# Patient Record
Sex: Male | Born: 1950
Health system: Southern US, Community
[De-identification: ages and names within clinical notes are randomized; demographics above are authoritative.]

## PROBLEM LIST (undated history)

## (undated) DIAGNOSIS — E119 Type 2 diabetes mellitus without complications: Secondary | ICD-10-CM

## (undated) DIAGNOSIS — E785 Hyperlipidemia, unspecified: Secondary | ICD-10-CM

## (undated) DIAGNOSIS — F419 Anxiety disorder, unspecified: Secondary | ICD-10-CM

## (undated) HISTORY — DX: Type 2 diabetes mellitus without complications: E11.9

## (undated) HISTORY — DX: Anxiety disorder, unspecified: F41.9

## (undated) HISTORY — PX: EYE SURGERY: SHX253

## (undated) HISTORY — DX: Hyperlipidemia, unspecified: E78.5

---

## 2013-05-22 ENCOUNTER — Ambulatory Visit
Admission: RE | Admit: 2013-05-22 | Discharge: 2013-05-22 | Disposition: A | Discharge status: Home / Self Care | Payer: BC Managed Care – PPO | Source: Ambulatory Visit | Attending: Family Medicine | Admitting: Family Medicine

## 2013-05-22 ENCOUNTER — Other Ambulatory Visit: Payer: Self-pay | Admitting: Family Medicine

## 2013-05-22 DIAGNOSIS — R059 Cough, unspecified: Secondary | ICD-10-CM

## 2013-05-22 DIAGNOSIS — R05 Cough: Secondary | ICD-10-CM

## 2013-05-22 DIAGNOSIS — R7989 Other specified abnormal findings of blood chemistry: Secondary | ICD-10-CM

## 2013-05-22 DIAGNOSIS — R079 Chest pain, unspecified: Secondary | ICD-10-CM

## 2013-05-22 MED ORDER — IOHEXOL 350 MG/ML SOLN
200.0000 mL | Freq: Once | INTRAVENOUS | Status: AC | PRN
  Administered 2013-05-22: 200 mL via INTRAVENOUS

## 2014-07-27 ENCOUNTER — Encounter: Payer: BLUE CROSS/BLUE SHIELD | Attending: Family Medicine

## 2014-07-27 VITALS — Ht 72.0 in | Wt 206.2 lb

## 2014-07-27 DIAGNOSIS — Z713 Dietary counseling and surveillance: Secondary | ICD-10-CM | POA: Diagnosis not present

## 2014-07-27 DIAGNOSIS — E118 Type 2 diabetes mellitus with unspecified complications: Secondary | ICD-10-CM | POA: Insufficient documentation

## 2014-07-27 DIAGNOSIS — E1165 Type 2 diabetes mellitus with hyperglycemia: Secondary | ICD-10-CM | POA: Diagnosis not present

## 2014-07-27 DIAGNOSIS — IMO0002 Reserved for concepts with insufficient information to code with codable children: Secondary | ICD-10-CM

## 2014-07-30 NOTE — Progress Notes (Signed)

## 2014-08-03 DIAGNOSIS — E119 Type 2 diabetes mellitus without complications: Secondary | ICD-10-CM

## 2014-08-03 DIAGNOSIS — E118 Type 2 diabetes mellitus with unspecified complications: Secondary | ICD-10-CM | POA: Diagnosis not present

## 2014-08-03 NOTE — Progress Notes (Signed)

## 2014-08-10 DIAGNOSIS — E119 Type 2 diabetes mellitus without complications: Secondary | ICD-10-CM

## 2014-08-10 DIAGNOSIS — E118 Type 2 diabetes mellitus with unspecified complications: Secondary | ICD-10-CM | POA: Diagnosis not present

## 2014-08-10 NOTE — Progress Notes (Signed)
Patient was seen on 08/10/14 for the third of a series of three diabetes self-management courses at the [facility]. The following learning objectives were met by the patient during this class:  . State the amount of activity recommended for healthy living . Describe activities suitable for individual needs . Identify ways to regularly incorporate activity into daily life . Identify barriers to activity and ways to over come these barriers  Identify diabetes medications being personally used and their primary action for lowering glucose and possible side effects . Describe role of stress on blood glucose and develop strategies to address psychosocial issues . Identify diabetes complications and ways to prevent them  Explain how to manage diabetes during illness . Evaluate success in meeting personal goal . Establish 2-3 goals that they will plan to diligently work on until they return for the  4-month follow-up visit  Goals:   I will count my carb choices at most meals and snacks  I will be active 30 minutes or more 3 times a week  I will take my diabetes medications as scheduled  I will test my glucose at least 2 times a day, 4 days a week  Your patient has identified these potential barriers to change:  Motivation  Your patient has identified their diabetes self-care support plan as  [facility] Support Group available Family Support Plan:  Attend [facility] in 4 months   

## 2015-06-12 IMAGING — CT CT ANGIO CHEST
1 of 12 series · 11 of 38 positions shown · IV contrast ([ID] [id number])
Comparison: Chest radiograph May 21, 2013.

CLINICAL DATA: A evaluate for pulmonary embolism, at elevated
D-dimer with cough and shortness of breath.

EXAM:
CT ANGIOGRAPHY CHEST WITH CONTRAST
TECHNIQUE: Multidetector CT imaging of the chest was performed using the
standard protocol during bolus administration of intravenous
contrast. Multiplanar CT image reconstructions including MIPs were
obtained to evaluate the vascular anatomy.
CONTRAST:  200mL OMNIPAQUE IOHEXOL 350 MG/ML SOLN

[Series 6: pe thin 1.25 · axial · 0.77mm/px · z∈[-198,+34]mm · 11 of 279 slices shown]
[im 24/279  lung]
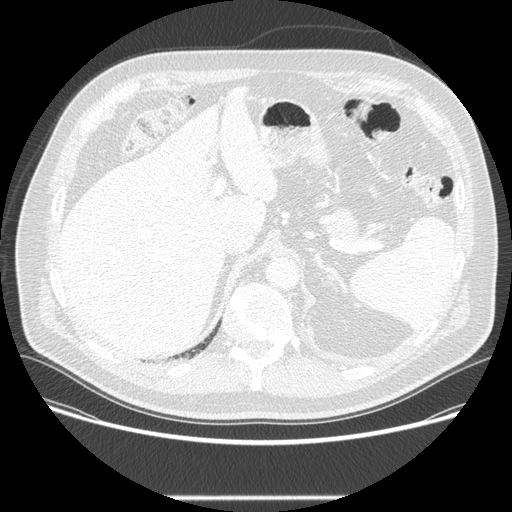
[im 47/279  mediastinal]
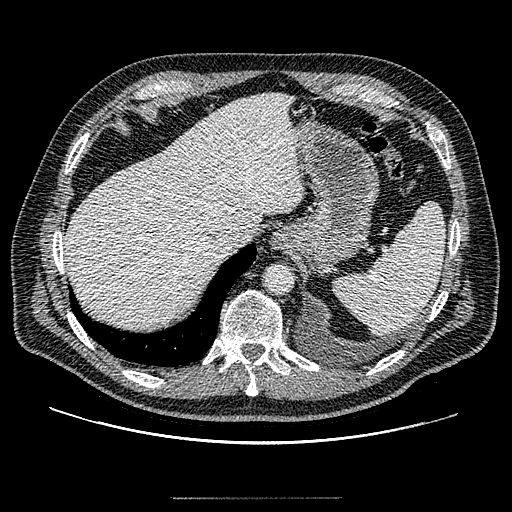
[im 70/279  lung]
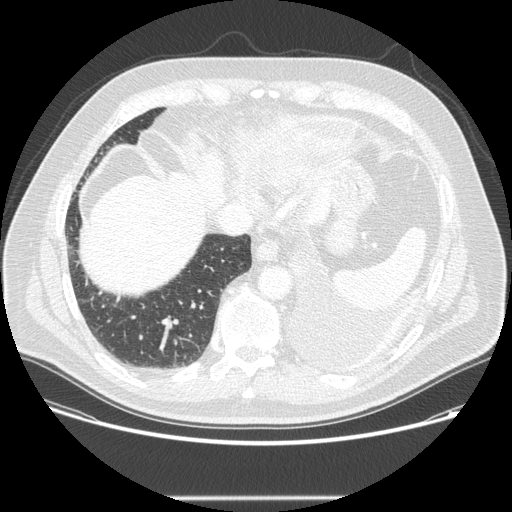
[im 93/279  mediastinal]
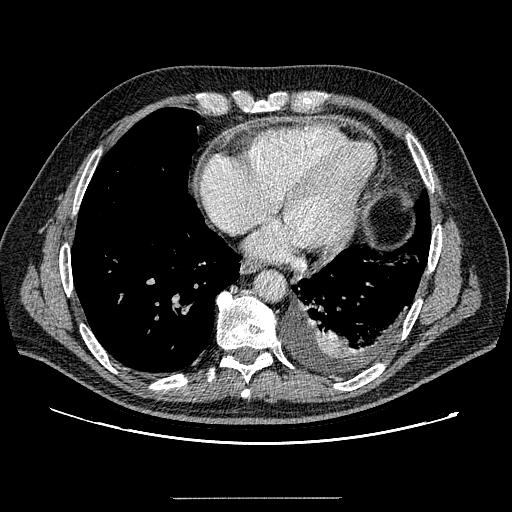
[im 116/279  lung]
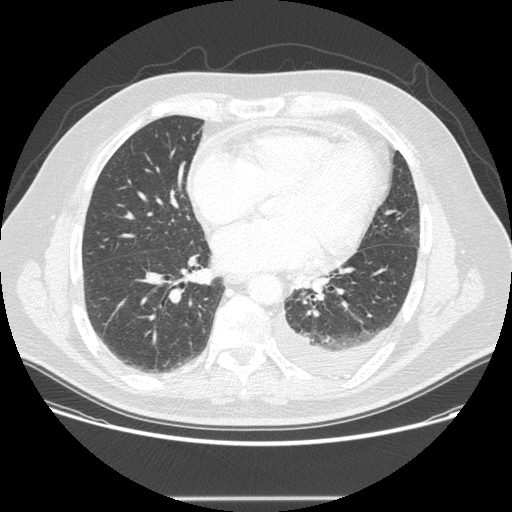
[im 140/279  mediastinal]
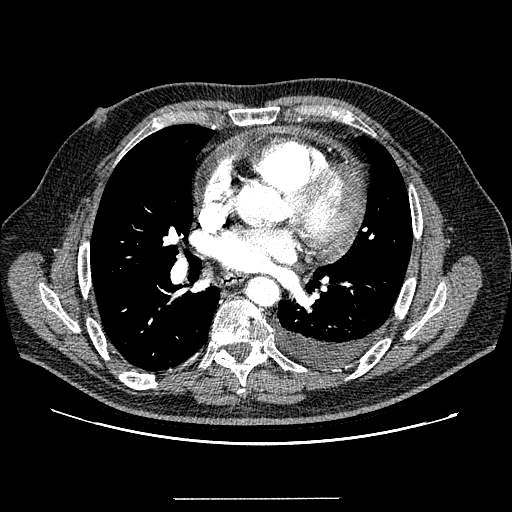
[im 163/279  lung]
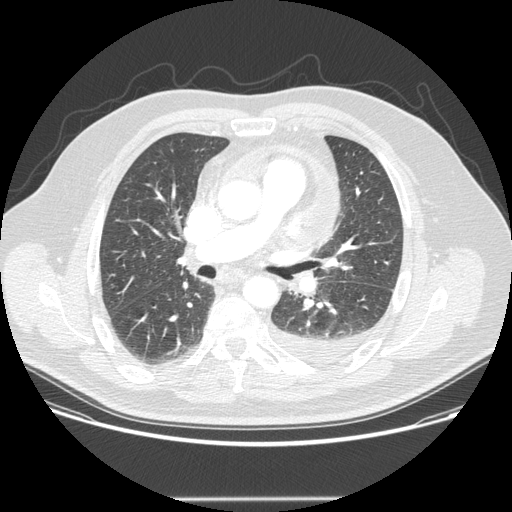
[im 186/279  mediastinal]
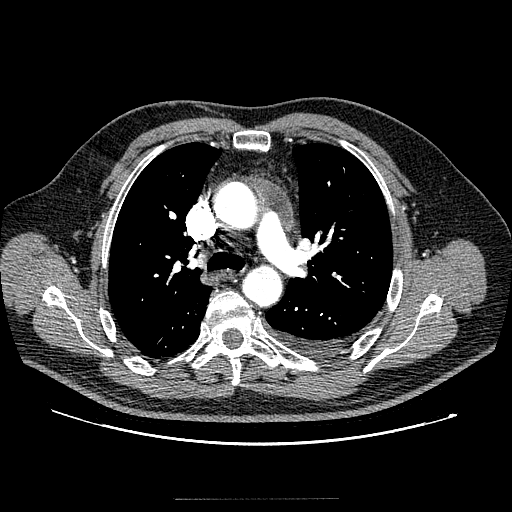
[im 209/279  lung]
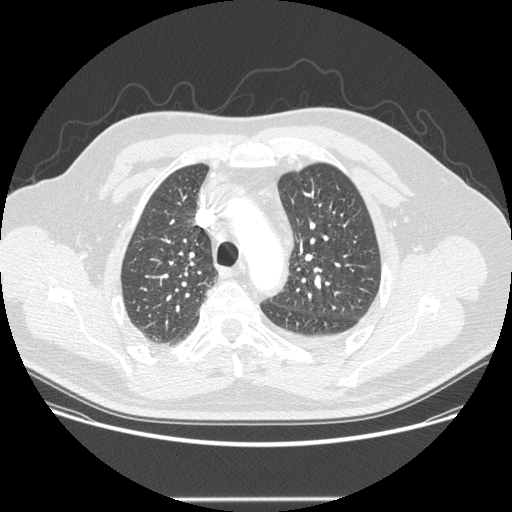
[im 232/279  mediastinal]
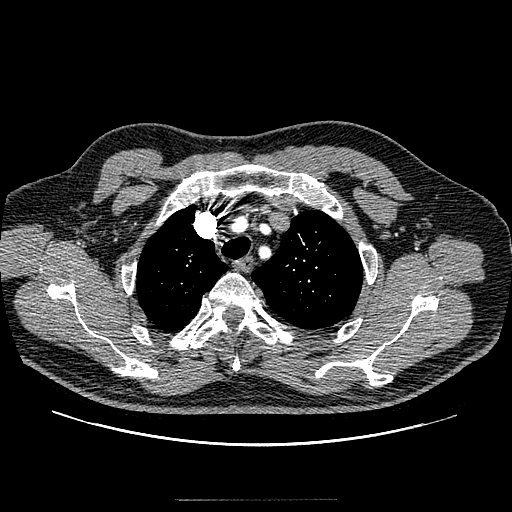
[im 255/279  lung]
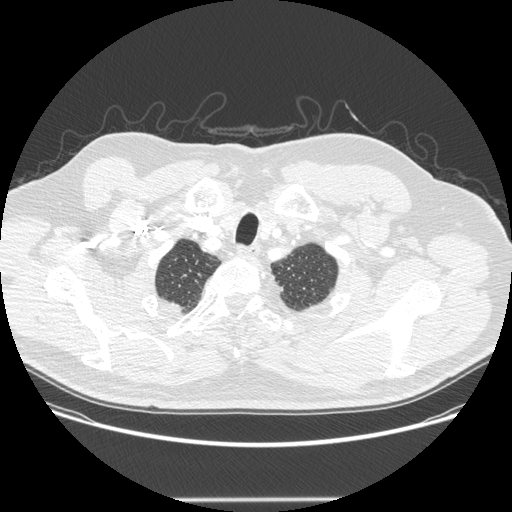

[11 of 38 positions shown; findings below may reference images not displayed]

FINDINGS: Please note, the scanner stopped scanning in the middle of the exam.
Contrast was Re administered, and a 2nd set of images was obtained
through the inferior aspect of the chest, with contrast in the
aorta.

Pulmonary artery is not enlarged. No pulmonary arterial filling
defects the level of the subsegmental branches.

Small to moderate pericardial effusion, with Jola Bard and
enhancement of the visceral and parietal pericardial layers. Small
left pleural effusion with enhancing atelectasis in left lung base.
Minimal dependent atelectasis in right lung. Minimal right greater
than left apical scarring. No pulmonary nodules, masses.
Tracheobronchial tree is patent and midline. No pneumothorax.

Heart size is mildly enlarged, mild Coronary artery calcifications.
Aorta is normal in course and caliber with mild calcific
atherosclerosis. Subcentimeter prevascular, aortopulmonary and
pretracheal lymph nodes. Thoracic esophagus is unremarkable.

Included view of the abdomen demonstrates too small to characterize
hypodensity in left mid interpolar kidney. Soft tissues and included
osseous structures are nonsuspicious.

Review of the MIP images confirms the above findings.
IMPRESSION: No pulmonary embolism.

Small to moderate pericardial effusion with pericardial enhancement
concerning for acute pericarditis which may be infectious or
inflammatory.

Small left pleural effusion with enhancing atelectasis.

  By: Johannes N Maambo

## 2016-10-09 DIAGNOSIS — R410 Disorientation, unspecified: Secondary | ICD-10-CM | POA: Diagnosis not present

## 2016-10-09 DIAGNOSIS — E119 Type 2 diabetes mellitus without complications: Secondary | ICD-10-CM | POA: Diagnosis not present

## 2016-10-24 DIAGNOSIS — I10 Essential (primary) hypertension: Secondary | ICD-10-CM | POA: Diagnosis not present

## 2016-10-24 DIAGNOSIS — R5381 Other malaise: Secondary | ICD-10-CM | POA: Diagnosis not present

## 2016-10-24 DIAGNOSIS — Z0000 Encounter for general adult medical examination without abnormal findings: Secondary | ICD-10-CM | POA: Diagnosis not present

## 2016-10-24 DIAGNOSIS — Z7984 Long term (current) use of oral hypoglycemic drugs: Secondary | ICD-10-CM | POA: Diagnosis not present

## 2016-10-24 DIAGNOSIS — E7800 Pure hypercholesterolemia, unspecified: Secondary | ICD-10-CM | POA: Diagnosis not present

## 2016-10-24 DIAGNOSIS — E119 Type 2 diabetes mellitus without complications: Secondary | ICD-10-CM | POA: Diagnosis not present

## 2016-10-24 DIAGNOSIS — Z23 Encounter for immunization: Secondary | ICD-10-CM | POA: Diagnosis not present

## 2017-02-05 DIAGNOSIS — R260 Ataxic gait: Secondary | ICD-10-CM | POA: Diagnosis present

## 2017-02-05 DIAGNOSIS — H579 Unspecified disorder of eye and adnexa: Secondary | ICD-10-CM | POA: Diagnosis present

## 2017-02-05 DIAGNOSIS — R0681 Apnea, not elsewhere classified: Secondary | ICD-10-CM | POA: Diagnosis not present

## 2017-02-05 DIAGNOSIS — E785 Hyperlipidemia, unspecified: Secondary | ICD-10-CM | POA: Diagnosis present

## 2017-02-05 DIAGNOSIS — R0683 Snoring: Secondary | ICD-10-CM | POA: Diagnosis not present

## 2017-02-05 DIAGNOSIS — G8324 Monoplegia of upper limb affecting left nondominant side: Secondary | ICD-10-CM | POA: Diagnosis present

## 2017-02-05 DIAGNOSIS — R270 Ataxia, unspecified: Secondary | ICD-10-CM | POA: Diagnosis not present

## 2017-02-05 DIAGNOSIS — G478 Other sleep disorders: Secondary | ICD-10-CM | POA: Diagnosis not present

## 2017-02-05 DIAGNOSIS — Z887 Allergy status to serum and vaccine status: Secondary | ICD-10-CM | POA: Diagnosis not present

## 2017-02-05 DIAGNOSIS — E119 Type 2 diabetes mellitus without complications: Secondary | ICD-10-CM | POA: Diagnosis present

## 2017-02-05 DIAGNOSIS — Z7982 Long term (current) use of aspirin: Secondary | ICD-10-CM | POA: Diagnosis not present

## 2017-02-05 DIAGNOSIS — Z7984 Long term (current) use of oral hypoglycemic drugs: Secondary | ICD-10-CM | POA: Diagnosis not present

## 2017-02-05 DIAGNOSIS — I639 Cerebral infarction, unspecified: Secondary | ICD-10-CM | POA: Diagnosis present

## 2017-02-05 DIAGNOSIS — I10 Essential (primary) hypertension: Secondary | ICD-10-CM | POA: Diagnosis present

## 2017-02-05 DIAGNOSIS — Z9114 Patient's other noncompliance with medication regimen: Secondary | ICD-10-CM | POA: Diagnosis not present

## 2017-02-06 DIAGNOSIS — G478 Other sleep disorders: Secondary | ICD-10-CM | POA: Diagnosis not present

## 2017-02-06 DIAGNOSIS — R0683 Snoring: Secondary | ICD-10-CM | POA: Diagnosis not present

## 2017-02-06 DIAGNOSIS — R0681 Apnea, not elsewhere classified: Secondary | ICD-10-CM | POA: Diagnosis not present

## 2017-02-06 DIAGNOSIS — I10 Essential (primary) hypertension: Secondary | ICD-10-CM | POA: Diagnosis not present

## 2017-02-22 DIAGNOSIS — G4733 Obstructive sleep apnea (adult) (pediatric): Secondary | ICD-10-CM | POA: Diagnosis not present

## 2017-02-26 DIAGNOSIS — R278 Other lack of coordination: Secondary | ICD-10-CM | POA: Diagnosis not present

## 2017-02-26 DIAGNOSIS — R2681 Unsteadiness on feet: Secondary | ICD-10-CM | POA: Diagnosis not present

## 2017-02-26 DIAGNOSIS — E1165 Type 2 diabetes mellitus with hyperglycemia: Secondary | ICD-10-CM | POA: Diagnosis not present

## 2017-02-26 DIAGNOSIS — I639 Cerebral infarction, unspecified: Secondary | ICD-10-CM | POA: Diagnosis not present

## 2017-02-26 DIAGNOSIS — I69398 Other sequelae of cerebral infarction: Secondary | ICD-10-CM | POA: Diagnosis not present

## 2017-03-11 DIAGNOSIS — G4733 Obstructive sleep apnea (adult) (pediatric): Secondary | ICD-10-CM | POA: Diagnosis not present

## 2017-03-11 DIAGNOSIS — I10 Essential (primary) hypertension: Secondary | ICD-10-CM | POA: Diagnosis not present

## 2017-03-11 DIAGNOSIS — E785 Hyperlipidemia, unspecified: Secondary | ICD-10-CM | POA: Diagnosis not present

## 2017-03-11 DIAGNOSIS — Z7689 Persons encountering health services in other specified circumstances: Secondary | ICD-10-CM | POA: Diagnosis not present

## 2017-03-20 DIAGNOSIS — I10 Essential (primary) hypertension: Secondary | ICD-10-CM | POA: Diagnosis not present

## 2017-03-20 DIAGNOSIS — Z8673 Personal history of transient ischemic attack (TIA), and cerebral infarction without residual deficits: Secondary | ICD-10-CM | POA: Diagnosis not present

## 2017-03-20 DIAGNOSIS — I471 Supraventricular tachycardia: Secondary | ICD-10-CM | POA: Diagnosis not present

## 2017-03-20 DIAGNOSIS — E785 Hyperlipidemia, unspecified: Secondary | ICD-10-CM | POA: Diagnosis not present

## 2017-03-20 DIAGNOSIS — K0530 Chronic periodontitis, unspecified: Secondary | ICD-10-CM | POA: Diagnosis not present

## 2017-03-20 DIAGNOSIS — R6889 Other general symptoms and signs: Secondary | ICD-10-CM | POA: Diagnosis not present

## 2017-03-20 DIAGNOSIS — Z888 Allergy status to other drugs, medicaments and biological substances status: Secondary | ICD-10-CM | POA: Diagnosis not present

## 2017-03-20 DIAGNOSIS — I493 Ventricular premature depolarization: Secondary | ICD-10-CM | POA: Diagnosis not present

## 2017-03-20 DIAGNOSIS — Z887 Allergy status to serum and vaccine status: Secondary | ICD-10-CM | POA: Diagnosis not present

## 2017-03-20 DIAGNOSIS — Z9181 History of falling: Secondary | ICD-10-CM | POA: Diagnosis not present

## 2017-03-20 DIAGNOSIS — E119 Type 2 diabetes mellitus without complications: Secondary | ICD-10-CM | POA: Diagnosis not present

## 2017-03-20 DIAGNOSIS — I483 Typical atrial flutter: Secondary | ICD-10-CM | POA: Diagnosis not present

## 2017-03-20 DIAGNOSIS — Z7984 Long term (current) use of oral hypoglycemic drugs: Secondary | ICD-10-CM | POA: Diagnosis not present

## 2017-03-20 DIAGNOSIS — K0889 Other specified disorders of teeth and supporting structures: Secondary | ICD-10-CM | POA: Diagnosis not present

## 2017-03-20 DIAGNOSIS — F329 Major depressive disorder, single episode, unspecified: Secondary | ICD-10-CM | POA: Diagnosis not present

## 2017-03-20 DIAGNOSIS — G4733 Obstructive sleep apnea (adult) (pediatric): Secondary | ICD-10-CM | POA: Diagnosis not present

## 2017-03-20 DIAGNOSIS — K069 Disorder of gingiva and edentulous alveolar ridge, unspecified: Secondary | ICD-10-CM | POA: Diagnosis not present

## 2017-03-20 DIAGNOSIS — R9431 Abnormal electrocardiogram [ECG] [EKG]: Secondary | ICD-10-CM | POA: Diagnosis not present

## 2017-03-20 DIAGNOSIS — K029 Dental caries, unspecified: Secondary | ICD-10-CM | POA: Diagnosis not present

## 2017-03-20 DIAGNOSIS — I491 Atrial premature depolarization: Secondary | ICD-10-CM | POA: Diagnosis not present

## 2017-03-20 DIAGNOSIS — I4892 Unspecified atrial flutter: Secondary | ICD-10-CM | POA: Diagnosis not present

## 2017-03-20 DIAGNOSIS — Z7982 Long term (current) use of aspirin: Secondary | ICD-10-CM | POA: Diagnosis not present

## 2017-03-20 DIAGNOSIS — Z79899 Other long term (current) drug therapy: Secondary | ICD-10-CM | POA: Diagnosis not present

## 2017-03-20 DIAGNOSIS — R000 Tachycardia, unspecified: Secondary | ICD-10-CM | POA: Diagnosis not present

## 2017-03-20 DIAGNOSIS — I4891 Unspecified atrial fibrillation: Secondary | ICD-10-CM | POA: Diagnosis not present

## 2017-03-21 DIAGNOSIS — I4891 Unspecified atrial fibrillation: Secondary | ICD-10-CM | POA: Diagnosis not present

## 2017-03-21 DIAGNOSIS — I493 Ventricular premature depolarization: Secondary | ICD-10-CM | POA: Diagnosis not present

## 2017-03-21 DIAGNOSIS — R6889 Other general symptoms and signs: Secondary | ICD-10-CM | POA: Diagnosis not present

## 2017-03-21 DIAGNOSIS — E119 Type 2 diabetes mellitus without complications: Secondary | ICD-10-CM | POA: Diagnosis not present

## 2017-03-21 DIAGNOSIS — I471 Supraventricular tachycardia: Secondary | ICD-10-CM | POA: Diagnosis not present

## 2017-03-21 DIAGNOSIS — I4892 Unspecified atrial flutter: Secondary | ICD-10-CM | POA: Diagnosis not present

## 2017-03-21 DIAGNOSIS — I483 Typical atrial flutter: Secondary | ICD-10-CM | POA: Diagnosis not present

## 2017-03-21 DIAGNOSIS — R000 Tachycardia, unspecified: Secondary | ICD-10-CM | POA: Diagnosis not present

## 2017-03-21 DIAGNOSIS — I10 Essential (primary) hypertension: Secondary | ICD-10-CM | POA: Diagnosis not present

## 2017-03-21 DIAGNOSIS — E785 Hyperlipidemia, unspecified: Secondary | ICD-10-CM | POA: Diagnosis not present

## 2017-03-22 DIAGNOSIS — I493 Ventricular premature depolarization: Secondary | ICD-10-CM | POA: Diagnosis not present

## 2017-03-22 DIAGNOSIS — I4892 Unspecified atrial flutter: Secondary | ICD-10-CM | POA: Diagnosis not present

## 2017-03-22 DIAGNOSIS — R000 Tachycardia, unspecified: Secondary | ICD-10-CM | POA: Diagnosis not present

## 2017-03-22 DIAGNOSIS — E785 Hyperlipidemia, unspecified: Secondary | ICD-10-CM | POA: Diagnosis not present

## 2017-03-22 DIAGNOSIS — I483 Typical atrial flutter: Secondary | ICD-10-CM | POA: Diagnosis not present

## 2017-03-22 DIAGNOSIS — E119 Type 2 diabetes mellitus without complications: Secondary | ICD-10-CM | POA: Diagnosis not present

## 2017-03-22 DIAGNOSIS — R9431 Abnormal electrocardiogram [ECG] [EKG]: Secondary | ICD-10-CM | POA: Diagnosis not present

## 2017-03-23 DIAGNOSIS — I4892 Unspecified atrial flutter: Secondary | ICD-10-CM | POA: Diagnosis not present

## 2017-03-23 DIAGNOSIS — I491 Atrial premature depolarization: Secondary | ICD-10-CM | POA: Diagnosis not present

## 2017-03-23 DIAGNOSIS — R9431 Abnormal electrocardiogram [ECG] [EKG]: Secondary | ICD-10-CM | POA: Diagnosis not present

## 2017-03-23 DIAGNOSIS — E119 Type 2 diabetes mellitus without complications: Secondary | ICD-10-CM | POA: Diagnosis not present

## 2017-03-23 DIAGNOSIS — I10 Essential (primary) hypertension: Secondary | ICD-10-CM | POA: Diagnosis not present

## 2017-03-23 DIAGNOSIS — R000 Tachycardia, unspecified: Secondary | ICD-10-CM | POA: Diagnosis not present

## 2017-04-04 DIAGNOSIS — G4733 Obstructive sleep apnea (adult) (pediatric): Secondary | ICD-10-CM | POA: Diagnosis not present

## 2017-04-24 DIAGNOSIS — G4733 Obstructive sleep apnea (adult) (pediatric): Secondary | ICD-10-CM | POA: Diagnosis not present

## 2017-04-24 DIAGNOSIS — I10 Essential (primary) hypertension: Secondary | ICD-10-CM | POA: Diagnosis not present

## 2017-04-24 DIAGNOSIS — I4892 Unspecified atrial flutter: Secondary | ICD-10-CM | POA: Diagnosis not present

## 2017-04-24 DIAGNOSIS — Z9889 Other specified postprocedural states: Secondary | ICD-10-CM | POA: Diagnosis not present

## 2017-04-26 DIAGNOSIS — E119 Type 2 diabetes mellitus without complications: Secondary | ICD-10-CM | POA: Diagnosis not present

## 2017-04-26 DIAGNOSIS — I10 Essential (primary) hypertension: Secondary | ICD-10-CM | POA: Diagnosis not present

## 2017-04-26 DIAGNOSIS — F430 Acute stress reaction: Secondary | ICD-10-CM | POA: Diagnosis not present

## 2017-10-22 DIAGNOSIS — I10 Essential (primary) hypertension: Secondary | ICD-10-CM | POA: Diagnosis not present

## 2017-10-22 DIAGNOSIS — E7800 Pure hypercholesterolemia, unspecified: Secondary | ICD-10-CM | POA: Diagnosis not present

## 2017-10-22 DIAGNOSIS — Z0000 Encounter for general adult medical examination without abnormal findings: Secondary | ICD-10-CM | POA: Diagnosis not present

## 2017-10-22 DIAGNOSIS — E119 Type 2 diabetes mellitus without complications: Secondary | ICD-10-CM | POA: Diagnosis not present

## 2017-10-22 DIAGNOSIS — F430 Acute stress reaction: Secondary | ICD-10-CM | POA: Diagnosis not present

## 2018-02-28 DIAGNOSIS — F430 Acute stress reaction: Secondary | ICD-10-CM | POA: Diagnosis not present

## 2018-03-12 DIAGNOSIS — R479 Unspecified speech disturbances: Secondary | ICD-10-CM | POA: Diagnosis not present

## 2018-03-12 DIAGNOSIS — Z7982 Long term (current) use of aspirin: Secondary | ICD-10-CM | POA: Diagnosis not present

## 2018-03-12 DIAGNOSIS — E785 Hyperlipidemia, unspecified: Secondary | ICD-10-CM | POA: Diagnosis not present

## 2018-03-12 DIAGNOSIS — R2689 Other abnormalities of gait and mobility: Secondary | ICD-10-CM | POA: Diagnosis not present

## 2018-03-12 DIAGNOSIS — Z79899 Other long term (current) drug therapy: Secondary | ICD-10-CM | POA: Diagnosis not present

## 2018-03-12 DIAGNOSIS — F3289 Other specified depressive episodes: Secondary | ICD-10-CM | POA: Diagnosis not present

## 2018-03-12 DIAGNOSIS — I69398 Other sequelae of cerebral infarction: Secondary | ICD-10-CM | POA: Diagnosis not present

## 2018-03-12 DIAGNOSIS — F0631 Mood disorder due to known physiological condition with depressive features: Secondary | ICD-10-CM | POA: Diagnosis not present

## 2018-03-12 DIAGNOSIS — R2681 Unsteadiness on feet: Secondary | ICD-10-CM | POA: Diagnosis not present

## 2018-03-12 DIAGNOSIS — E119 Type 2 diabetes mellitus without complications: Secondary | ICD-10-CM | POA: Diagnosis not present

## 2018-03-12 DIAGNOSIS — I10 Essential (primary) hypertension: Secondary | ICD-10-CM | POA: Diagnosis not present

## 2018-03-12 DIAGNOSIS — Z7984 Long term (current) use of oral hypoglycemic drugs: Secondary | ICD-10-CM | POA: Diagnosis not present

## 2018-03-22 DIAGNOSIS — I639 Cerebral infarction, unspecified: Secondary | ICD-10-CM | POA: Diagnosis not present

## 2018-03-22 DIAGNOSIS — I6782 Cerebral ischemia: Secondary | ICD-10-CM | POA: Diagnosis not present

## 2018-03-22 DIAGNOSIS — Z8673 Personal history of transient ischemic attack (TIA), and cerebral infarction without residual deficits: Secondary | ICD-10-CM | POA: Diagnosis not present

## 2018-04-24 DIAGNOSIS — E1169 Type 2 diabetes mellitus with other specified complication: Secondary | ICD-10-CM | POA: Diagnosis not present

## 2018-04-24 DIAGNOSIS — Z23 Encounter for immunization: Secondary | ICD-10-CM | POA: Diagnosis not present

## 2018-04-24 DIAGNOSIS — F411 Generalized anxiety disorder: Secondary | ICD-10-CM | POA: Diagnosis not present

## 2018-05-13 DIAGNOSIS — H2512 Age-related nuclear cataract, left eye: Secondary | ICD-10-CM | POA: Diagnosis not present

## 2018-05-21 DIAGNOSIS — H2513 Age-related nuclear cataract, bilateral: Secondary | ICD-10-CM | POA: Diagnosis not present

## 2018-05-21 DIAGNOSIS — H5213 Myopia, bilateral: Secondary | ICD-10-CM | POA: Diagnosis not present

## 2018-06-05 DIAGNOSIS — J069 Acute upper respiratory infection, unspecified: Secondary | ICD-10-CM | POA: Diagnosis not present

## 2018-06-24 DIAGNOSIS — H2512 Age-related nuclear cataract, left eye: Secondary | ICD-10-CM | POA: Diagnosis not present

## 2018-06-24 DIAGNOSIS — H25812 Combined forms of age-related cataract, left eye: Secondary | ICD-10-CM | POA: Diagnosis not present

## 2018-07-29 DIAGNOSIS — E1169 Type 2 diabetes mellitus with other specified complication: Secondary | ICD-10-CM | POA: Diagnosis not present

## 2018-07-29 DIAGNOSIS — M25519 Pain in unspecified shoulder: Secondary | ICD-10-CM | POA: Diagnosis not present

## 2018-08-06 DIAGNOSIS — H2511 Age-related nuclear cataract, right eye: Secondary | ICD-10-CM | POA: Diagnosis not present

## 2018-08-26 DIAGNOSIS — H2511 Age-related nuclear cataract, right eye: Secondary | ICD-10-CM | POA: Diagnosis not present

## 2018-08-26 DIAGNOSIS — Z961 Presence of intraocular lens: Secondary | ICD-10-CM | POA: Diagnosis not present

## 2018-08-26 DIAGNOSIS — H34811 Central retinal vein occlusion, right eye, with macular edema: Secondary | ICD-10-CM | POA: Diagnosis not present

## 2018-08-26 DIAGNOSIS — H4710 Unspecified papilledema: Secondary | ICD-10-CM | POA: Diagnosis not present

## 2018-08-26 DIAGNOSIS — H47021 Hemorrhage in optic nerve sheath, right eye: Secondary | ICD-10-CM | POA: Diagnosis not present

## 2018-08-26 DIAGNOSIS — H43811 Vitreous degeneration, right eye: Secondary | ICD-10-CM | POA: Diagnosis not present

## 2018-08-27 DIAGNOSIS — E119 Type 2 diabetes mellitus without complications: Secondary | ICD-10-CM | POA: Diagnosis not present

## 2018-08-27 DIAGNOSIS — H539 Unspecified visual disturbance: Secondary | ICD-10-CM | POA: Diagnosis not present

## 2018-08-27 DIAGNOSIS — I672 Cerebral atherosclerosis: Secondary | ICD-10-CM | POA: Diagnosis not present

## 2018-08-27 DIAGNOSIS — H538 Other visual disturbances: Secondary | ICD-10-CM | POA: Diagnosis not present

## 2018-09-18 DIAGNOSIS — H47321 Drusen of optic disc, right eye: Secondary | ICD-10-CM | POA: Diagnosis not present

## 2018-09-18 DIAGNOSIS — H02834 Dermatochalasis of left upper eyelid: Secondary | ICD-10-CM | POA: Diagnosis not present

## 2018-09-18 DIAGNOSIS — Z8669 Personal history of other diseases of the nervous system and sense organs: Secondary | ICD-10-CM | POA: Diagnosis not present

## 2018-09-18 DIAGNOSIS — Z961 Presence of intraocular lens: Secondary | ICD-10-CM | POA: Diagnosis not present

## 2018-09-18 DIAGNOSIS — H02831 Dermatochalasis of right upper eyelid: Secondary | ICD-10-CM | POA: Diagnosis not present

## 2018-09-18 DIAGNOSIS — H2511 Age-related nuclear cataract, right eye: Secondary | ICD-10-CM | POA: Diagnosis not present

## 2018-09-18 DIAGNOSIS — H269 Unspecified cataract: Secondary | ICD-10-CM | POA: Diagnosis not present

## 2018-09-18 DIAGNOSIS — H4710 Unspecified papilledema: Secondary | ICD-10-CM | POA: Diagnosis not present

## 2018-10-23 DIAGNOSIS — H4710 Unspecified papilledema: Secondary | ICD-10-CM | POA: Diagnosis not present

## 2018-12-04 DIAGNOSIS — H47012 Ischemic optic neuropathy, left eye: Secondary | ICD-10-CM | POA: Diagnosis not present

## 2019-03-27 DIAGNOSIS — E119 Type 2 diabetes mellitus without complications: Secondary | ICD-10-CM | POA: Diagnosis not present

## 2019-03-27 DIAGNOSIS — F329 Major depressive disorder, single episode, unspecified: Secondary | ICD-10-CM | POA: Diagnosis not present

## 2019-03-27 DIAGNOSIS — I10 Essential (primary) hypertension: Secondary | ICD-10-CM | POA: Diagnosis not present

## 2019-03-27 DIAGNOSIS — E1169 Type 2 diabetes mellitus with other specified complication: Secondary | ICD-10-CM | POA: Diagnosis not present

## 2019-05-15 DIAGNOSIS — F329 Major depressive disorder, single episode, unspecified: Secondary | ICD-10-CM | POA: Diagnosis not present

## 2019-05-15 DIAGNOSIS — E1169 Type 2 diabetes mellitus with other specified complication: Secondary | ICD-10-CM | POA: Diagnosis not present

## 2019-05-15 DIAGNOSIS — E119 Type 2 diabetes mellitus without complications: Secondary | ICD-10-CM | POA: Diagnosis not present

## 2019-05-15 DIAGNOSIS — I10 Essential (primary) hypertension: Secondary | ICD-10-CM | POA: Diagnosis not present

## 2019-10-14 DIAGNOSIS — H524 Presbyopia: Secondary | ICD-10-CM | POA: Diagnosis not present

## 2019-10-15 DIAGNOSIS — E1140 Type 2 diabetes mellitus with diabetic neuropathy, unspecified: Secondary | ICD-10-CM | POA: Diagnosis not present

## 2019-10-15 DIAGNOSIS — G629 Polyneuropathy, unspecified: Secondary | ICD-10-CM | POA: Diagnosis not present

## 2019-10-15 DIAGNOSIS — R634 Abnormal weight loss: Secondary | ICD-10-CM | POA: Diagnosis not present

## 2019-10-15 DIAGNOSIS — Z794 Long term (current) use of insulin: Secondary | ICD-10-CM | POA: Diagnosis not present

## 2019-10-15 DIAGNOSIS — F331 Major depressive disorder, recurrent, moderate: Secondary | ICD-10-CM | POA: Diagnosis not present

## 2019-10-15 DIAGNOSIS — F4310 Post-traumatic stress disorder, unspecified: Secondary | ICD-10-CM | POA: Diagnosis not present

## 2019-11-24 DIAGNOSIS — Z0100 Encounter for examination of eyes and vision without abnormal findings: Secondary | ICD-10-CM | POA: Diagnosis not present

## 2019-12-09 DIAGNOSIS — I10 Essential (primary) hypertension: Secondary | ICD-10-CM | POA: Diagnosis not present

## 2019-12-09 DIAGNOSIS — F331 Major depressive disorder, recurrent, moderate: Secondary | ICD-10-CM | POA: Diagnosis not present

## 2019-12-09 DIAGNOSIS — E1140 Type 2 diabetes mellitus with diabetic neuropathy, unspecified: Secondary | ICD-10-CM | POA: Diagnosis not present

## 2019-12-09 DIAGNOSIS — I639 Cerebral infarction, unspecified: Secondary | ICD-10-CM | POA: Diagnosis not present

## 2019-12-09 DIAGNOSIS — E7800 Pure hypercholesterolemia, unspecified: Secondary | ICD-10-CM | POA: Diagnosis not present

## 2020-01-01 DIAGNOSIS — I10 Essential (primary) hypertension: Secondary | ICD-10-CM | POA: Diagnosis not present

## 2020-01-01 DIAGNOSIS — F331 Major depressive disorder, recurrent, moderate: Secondary | ICD-10-CM | POA: Diagnosis not present

## 2020-01-01 DIAGNOSIS — F4310 Post-traumatic stress disorder, unspecified: Secondary | ICD-10-CM | POA: Diagnosis not present

## 2020-01-01 DIAGNOSIS — E1140 Type 2 diabetes mellitus with diabetic neuropathy, unspecified: Secondary | ICD-10-CM | POA: Diagnosis not present

## 2020-01-01 DIAGNOSIS — E7800 Pure hypercholesterolemia, unspecified: Secondary | ICD-10-CM | POA: Diagnosis not present

## 2020-02-23 DIAGNOSIS — Z85828 Personal history of other malignant neoplasm of skin: Secondary | ICD-10-CM | POA: Diagnosis not present

## 2020-02-23 DIAGNOSIS — L570 Actinic keratosis: Secondary | ICD-10-CM | POA: Diagnosis not present

## 2020-02-23 DIAGNOSIS — D229 Melanocytic nevi, unspecified: Secondary | ICD-10-CM | POA: Diagnosis not present

## 2020-02-23 DIAGNOSIS — L814 Other melanin hyperpigmentation: Secondary | ICD-10-CM | POA: Diagnosis not present

## 2020-02-23 DIAGNOSIS — L819 Disorder of pigmentation, unspecified: Secondary | ICD-10-CM | POA: Diagnosis not present

## 2020-02-23 DIAGNOSIS — L905 Scar conditions and fibrosis of skin: Secondary | ICD-10-CM | POA: Diagnosis not present

## 2020-02-23 DIAGNOSIS — D485 Neoplasm of uncertain behavior of skin: Secondary | ICD-10-CM | POA: Diagnosis not present

## 2020-02-23 DIAGNOSIS — L821 Other seborrheic keratosis: Secondary | ICD-10-CM | POA: Diagnosis not present

## 2020-03-22 DIAGNOSIS — C44519 Basal cell carcinoma of skin of other part of trunk: Secondary | ICD-10-CM | POA: Diagnosis not present

## 2020-04-04 DIAGNOSIS — Z794 Long term (current) use of insulin: Secondary | ICD-10-CM | POA: Diagnosis not present

## 2020-04-04 DIAGNOSIS — E1140 Type 2 diabetes mellitus with diabetic neuropathy, unspecified: Secondary | ICD-10-CM | POA: Diagnosis not present

## 2020-04-04 DIAGNOSIS — E7800 Pure hypercholesterolemia, unspecified: Secondary | ICD-10-CM | POA: Diagnosis not present

## 2020-07-03 DIAGNOSIS — F331 Major depressive disorder, recurrent, moderate: Secondary | ICD-10-CM | POA: Diagnosis not present

## 2020-07-03 DIAGNOSIS — E1140 Type 2 diabetes mellitus with diabetic neuropathy, unspecified: Secondary | ICD-10-CM | POA: Diagnosis not present

## 2020-07-03 DIAGNOSIS — I10 Essential (primary) hypertension: Secondary | ICD-10-CM | POA: Diagnosis not present

## 2020-07-03 DIAGNOSIS — E7800 Pure hypercholesterolemia, unspecified: Secondary | ICD-10-CM | POA: Diagnosis not present

## 2020-07-03 DIAGNOSIS — I639 Cerebral infarction, unspecified: Secondary | ICD-10-CM | POA: Diagnosis not present

## 2020-07-04 DIAGNOSIS — E1140 Type 2 diabetes mellitus with diabetic neuropathy, unspecified: Secondary | ICD-10-CM | POA: Diagnosis not present

## 2020-07-04 DIAGNOSIS — E7800 Pure hypercholesterolemia, unspecified: Secondary | ICD-10-CM | POA: Diagnosis not present

## 2020-08-30 DIAGNOSIS — E7800 Pure hypercholesterolemia, unspecified: Secondary | ICD-10-CM | POA: Diagnosis not present

## 2020-08-30 DIAGNOSIS — I10 Essential (primary) hypertension: Secondary | ICD-10-CM | POA: Diagnosis not present

## 2020-08-30 DIAGNOSIS — I639 Cerebral infarction, unspecified: Secondary | ICD-10-CM | POA: Diagnosis not present

## 2020-08-30 DIAGNOSIS — F331 Major depressive disorder, recurrent, moderate: Secondary | ICD-10-CM | POA: Diagnosis not present

## 2020-08-30 DIAGNOSIS — E1140 Type 2 diabetes mellitus with diabetic neuropathy, unspecified: Secondary | ICD-10-CM | POA: Diagnosis not present

## 2021-02-20 DIAGNOSIS — R0982 Postnasal drip: Secondary | ICD-10-CM | POA: Diagnosis not present

## 2021-02-20 DIAGNOSIS — Z20822 Contact with and (suspected) exposure to covid-19: Secondary | ICD-10-CM | POA: Diagnosis not present

## 2021-07-11 DIAGNOSIS — J069 Acute upper respiratory infection, unspecified: Secondary | ICD-10-CM | POA: Diagnosis not present
